# Patient Record
Sex: Female | Born: 1935 | Race: White | Hispanic: No | Marital: Single
Health system: Southern US, Community
[De-identification: ages and names within clinical notes are randomized; demographics above are authoritative.]

---

## 2004-02-22 ENCOUNTER — Other Ambulatory Visit: Payer: Self-pay

## 2004-03-07 ENCOUNTER — Ambulatory Visit: Payer: Self-pay | Admitting: General Practice

## 2004-04-05 ENCOUNTER — Other Ambulatory Visit: Payer: Self-pay

## 2004-04-05 ENCOUNTER — Emergency Department: Payer: Self-pay | Admitting: Emergency Medicine

## 2004-06-25 ENCOUNTER — Ambulatory Visit: Payer: Self-pay

## 2004-08-26 ENCOUNTER — Ambulatory Visit: Payer: Self-pay | Admitting: Internal Medicine

## 2005-08-05 ENCOUNTER — Ambulatory Visit: Payer: Self-pay | Admitting: Internal Medicine

## 2005-09-24 ENCOUNTER — Ambulatory Visit: Payer: Self-pay | Admitting: Internal Medicine

## 2005-10-01 ENCOUNTER — Ambulatory Visit: Payer: Self-pay | Admitting: Internal Medicine

## 2006-01-17 ENCOUNTER — Emergency Department: Payer: Self-pay

## 2006-01-17 ENCOUNTER — Other Ambulatory Visit: Payer: Self-pay

## 2006-04-09 ENCOUNTER — Ambulatory Visit: Payer: Self-pay | Admitting: Internal Medicine

## 2006-06-29 ENCOUNTER — Inpatient Hospital Stay: Payer: Self-pay | Admitting: Internal Medicine

## 2006-06-29 ENCOUNTER — Other Ambulatory Visit: Payer: Self-pay

## 2006-07-16 ENCOUNTER — Ambulatory Visit: Payer: Self-pay | Admitting: General Practice

## 2006-08-23 ENCOUNTER — Ambulatory Visit: Payer: Self-pay | Admitting: General Practice

## 2006-08-30 ENCOUNTER — Ambulatory Visit: Payer: Self-pay | Admitting: General Practice

## 2006-08-31 ENCOUNTER — Other Ambulatory Visit: Payer: Self-pay

## 2006-09-01 ENCOUNTER — Other Ambulatory Visit: Payer: Self-pay

## 2006-09-01 ENCOUNTER — Inpatient Hospital Stay: Payer: Self-pay | Admitting: Internal Medicine

## 2006-10-17 ENCOUNTER — Other Ambulatory Visit: Payer: Self-pay

## 2006-10-17 ENCOUNTER — Emergency Department: Payer: Self-pay | Admitting: Emergency Medicine

## 2007-02-03 ENCOUNTER — Inpatient Hospital Stay: Payer: Self-pay | Admitting: Rheumatology

## 2007-02-03 ENCOUNTER — Other Ambulatory Visit: Payer: Self-pay

## 2007-04-12 ENCOUNTER — Ambulatory Visit: Payer: Self-pay | Admitting: Internal Medicine

## 2007-04-17 ENCOUNTER — Emergency Department: Payer: Self-pay | Admitting: Emergency Medicine

## 2007-05-02 ENCOUNTER — Ambulatory Visit: Payer: Self-pay | Admitting: General Practice

## 2007-05-18 ENCOUNTER — Ambulatory Visit: Payer: Self-pay | Admitting: General Practice

## 2007-05-24 ENCOUNTER — Other Ambulatory Visit: Payer: Self-pay

## 2007-05-24 ENCOUNTER — Emergency Department: Payer: Self-pay | Admitting: Emergency Medicine

## 2007-06-14 ENCOUNTER — Ambulatory Visit: Payer: Self-pay | Admitting: Internal Medicine

## 2007-08-17 ENCOUNTER — Emergency Department: Payer: Self-pay | Admitting: Unknown Physician Specialty

## 2007-08-17 ENCOUNTER — Other Ambulatory Visit: Payer: Self-pay

## 2008-03-13 ENCOUNTER — Emergency Department: Payer: Self-pay | Admitting: Emergency Medicine

## 2008-04-13 ENCOUNTER — Ambulatory Visit: Payer: Self-pay | Admitting: Internal Medicine

## 2008-05-20 ENCOUNTER — Emergency Department: Payer: Self-pay | Admitting: Emergency Medicine

## 2008-05-21 ENCOUNTER — Emergency Department: Payer: Self-pay | Admitting: Emergency Medicine

## 2008-05-27 ENCOUNTER — Emergency Department: Payer: Self-pay | Admitting: Emergency Medicine

## 2008-06-18 ENCOUNTER — Emergency Department: Payer: Self-pay | Admitting: Emergency Medicine

## 2008-10-07 ENCOUNTER — Emergency Department: Payer: Self-pay | Admitting: Unknown Physician Specialty

## 2008-10-08 ENCOUNTER — Inpatient Hospital Stay: Payer: Self-pay | Admitting: Internal Medicine

## 2009-02-24 ENCOUNTER — Emergency Department: Payer: Self-pay | Admitting: Unknown Physician Specialty

## 2009-03-12 ENCOUNTER — Emergency Department: Payer: Self-pay | Admitting: Emergency Medicine

## 2009-03-15 ENCOUNTER — Emergency Department: Payer: Self-pay | Admitting: Emergency Medicine

## 2009-04-15 ENCOUNTER — Ambulatory Visit: Payer: Self-pay | Admitting: Psychiatry

## 2009-07-09 ENCOUNTER — Emergency Department: Payer: Self-pay | Admitting: Emergency Medicine

## 2009-08-01 ENCOUNTER — Ambulatory Visit: Payer: Self-pay

## 2009-09-11 ENCOUNTER — Ambulatory Visit: Payer: Self-pay

## 2010-10-22 IMAGING — CR DG THORACIC SPINE 2-3V
1 series · 2 of 2 positions shown · non-contrast
Comparison: none

REASON FOR EXAM: back pain
COMMENTS:

[Series 1: view not recorded · 0.17mm/px · 2 of 2 slices shown]
[im 1/2]
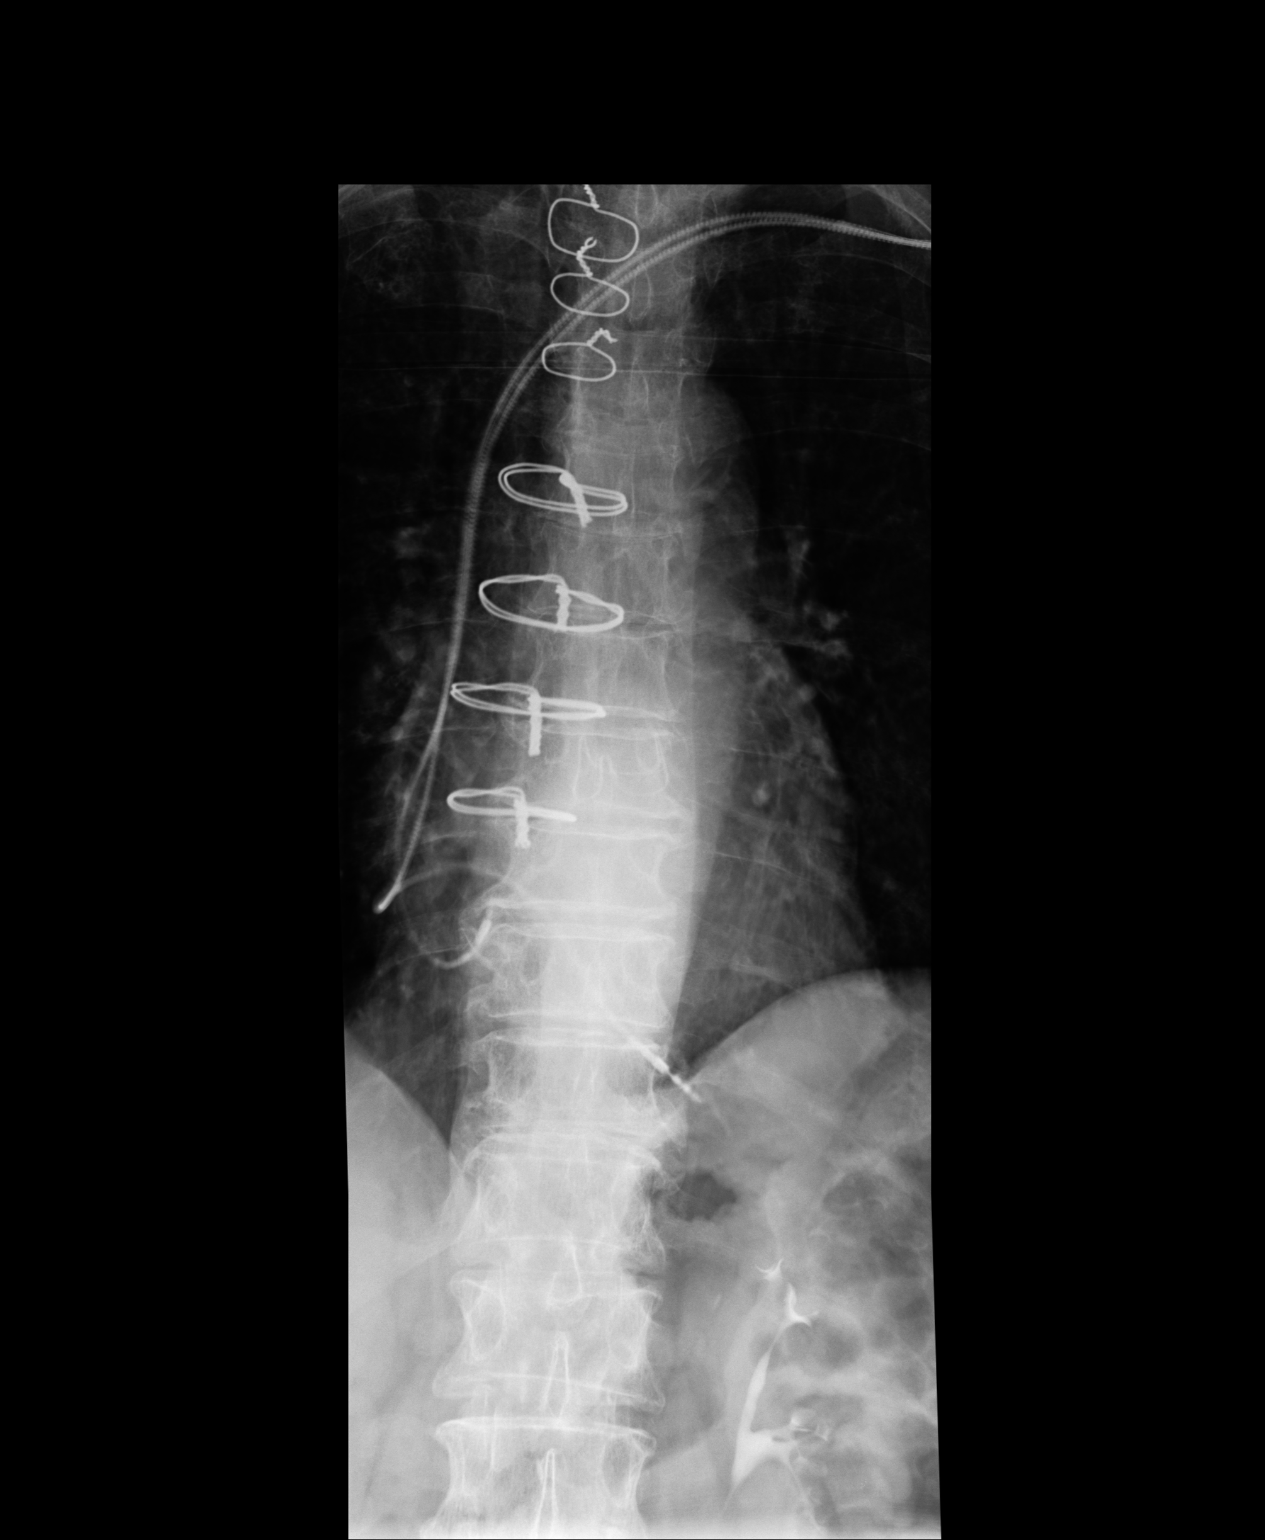
[im 2/2]
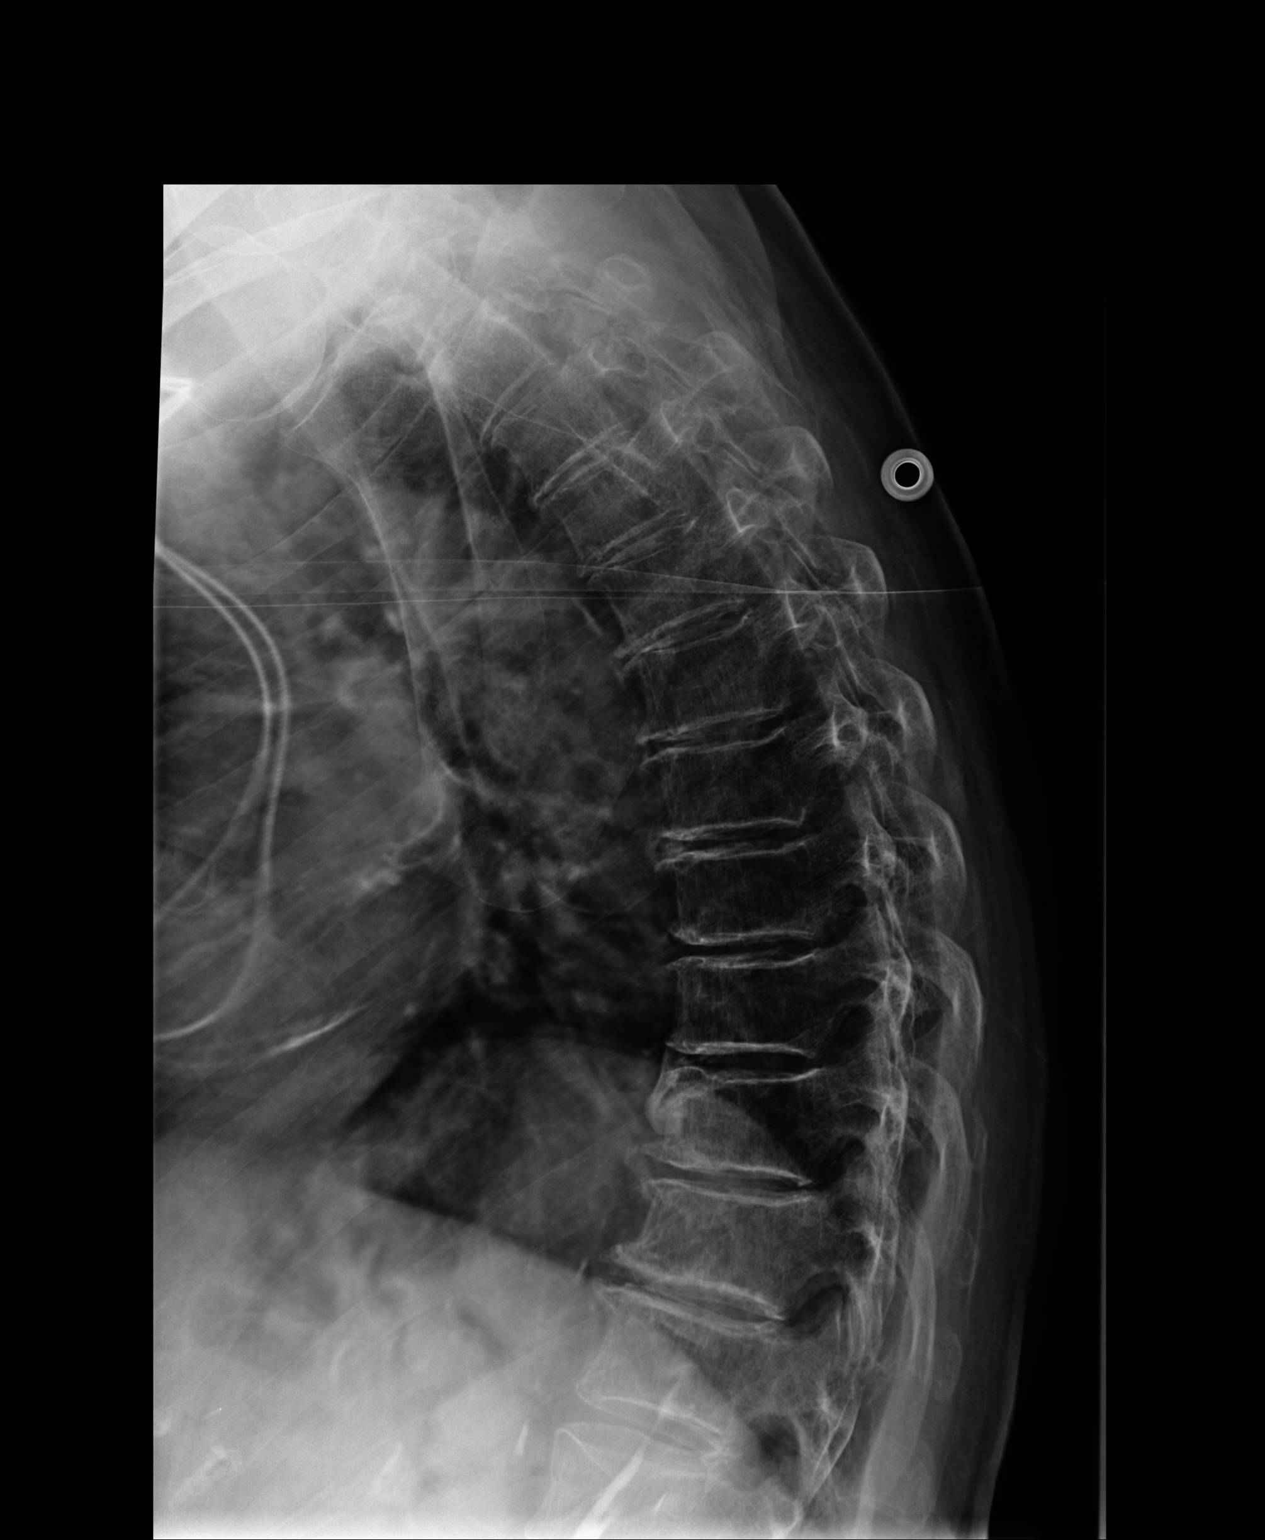

[2 of 2 positions shown; findings below may reference images not displayed]

PROCEDURE:     DXR - DXR THORACIC  AP AND LATERAL  - March 15, 2009  [DATE]

RESULT:     Findings: AP and lateral views of the thoracic spine are
provided. The upper thoracic spine is obscured by the overlying soft tissues
and osseous structures. There is generalized osteopenia. The alignment is
anatomic. There is mild thoracic spine spondylosis.

There is a mild compression fracture of the L1 vertebral body which is of
indeterminate age. If there is further concern regarding the acuity of the
compression fracture further evaluation with MRI or a bone scan may be
helpful.
IMPRESSION: Please see above.

## 2011-03-31 ENCOUNTER — Inpatient Hospital Stay: Payer: Self-pay | Admitting: Internal Medicine

## 2011-03-31 DIAGNOSIS — R5383 Other fatigue: Secondary | ICD-10-CM

## 2011-03-31 DIAGNOSIS — R5381 Other malaise: Secondary | ICD-10-CM

## 2011-04-24 ENCOUNTER — Inpatient Hospital Stay: Payer: Self-pay | Admitting: Internal Medicine

## 2011-04-24 ENCOUNTER — Ambulatory Visit: Payer: Self-pay | Admitting: Gastroenterology

## 2011-04-30 ENCOUNTER — Encounter: Payer: Self-pay | Admitting: Internal Medicine

## 2011-05-02 ENCOUNTER — Encounter: Payer: Self-pay | Admitting: Internal Medicine

## 2011-06-18 ENCOUNTER — Inpatient Hospital Stay: Payer: Self-pay | Admitting: Surgery

## 2011-06-18 LAB — CBC WITH DIFFERENTIAL/PLATELET
Basophil #: 0 10*3/uL (ref 0.0–0.1)
Basophil %: 0.2 %
Eosinophil %: 1 %
HCT: 34.1 % — ABNORMAL LOW (ref 35.0–47.0)
HGB: 11.2 g/dL — ABNORMAL LOW (ref 12.0–16.0)
Lymphocyte #: 1.4 10*3/uL (ref 1.0–3.6)
Lymphocyte %: 14.7 %
MCV: 85 fL (ref 80–100)
Monocyte #: 0.2 10*3/uL (ref 0.0–0.7)
RBC: 4 10*6/uL (ref 3.80–5.20)
RDW: 18 % — ABNORMAL HIGH (ref 11.5–14.5)
WBC: 9.4 10*3/uL (ref 3.6–11.0)

## 2011-06-18 LAB — BASIC METABOLIC PANEL
Anion Gap: 10 (ref 7–16)
BUN: 18 mg/dL (ref 7–18)
Calcium, Total: 8.4 mg/dL — ABNORMAL LOW (ref 8.5–10.1)
Calcium, Total: 8 mg/dL — ABNORMAL LOW (ref 8.5–10.1)
Creatinine: 0.5 mg/dL — ABNORMAL LOW (ref 0.60–1.30)
EGFR (African American): >60
EGFR (African American): >60
EGFR (Non-African Amer.): >60
EGFR (Non-African Amer.): >60
Glucose: 87 mg/dL (ref 65–99)
Glucose: 120 mg/dL — ABNORMAL HIGH (ref 65–99)
Osmolality: 292 (ref 275–301)
Osmolality: 287 (ref 275–301)
Potassium: 3.7 mmol/L (ref 3.5–5.1)
Sodium: 143 mmol/L (ref 136–145)

## 2011-06-19 LAB — CBC WITH DIFFERENTIAL/PLATELET
Basophil #: 0 10*3/uL (ref 0.0–0.1)
Basophil %: 0.1 %
Eosinophil %: 0 %
MCH: 28.2 pg (ref 26.0–34.0)
Monocyte #: 0.3 10*3/uL (ref 0.0–0.7)
Monocyte %: 2.5 %
Neutrophil #: 11.6 10*3/uL — ABNORMAL HIGH (ref 1.4–6.5)
Neutrophil %: 92.3 %
Platelet: 191 10*3/uL (ref 150–440)
RBC: 3.71 10*6/uL — ABNORMAL LOW (ref 3.80–5.20)
RDW: 17.8 % — ABNORMAL HIGH (ref 11.5–14.5)
WBC: 12.6 10*3/uL — ABNORMAL HIGH (ref 3.6–11.0)

## 2011-06-19 LAB — PATHOLOGY REPORT

## 2011-06-19 LAB — BASIC METABOLIC PANEL
Calcium, Total: 8 mg/dL — ABNORMAL LOW (ref 8.5–10.1)
Chloride: 109 mmol/L — ABNORMAL HIGH (ref 98–107)
Co2: 26 mmol/L (ref 21–32)
Creatinine: 0.73 mg/dL (ref 0.60–1.30)
EGFR (African American): >60
EGFR (Non-African Amer.): >60
Glucose: 197 mg/dL — ABNORMAL HIGH (ref 65–99)
Potassium: 4.5 mmol/L (ref 3.5–5.1)
Sodium: 141 mmol/L (ref 136–145)

## 2011-06-21 LAB — CBC WITH DIFFERENTIAL/PLATELET
Basophil #: 0 10*3/uL (ref 0.0–0.1)
Basophil %: 0.3 %
Eosinophil #: 0.2 10*3/uL (ref 0.0–0.7)
Eosinophil %: 4.1 %
Lymphocyte #: 1.2 10*3/uL (ref 1.0–3.6)
Lymphocyte %: 23.7 %
MCH: 28.3 pg (ref 26.0–34.0)
MCHC: 33.6 g/dL (ref 32.0–36.0)
Monocyte %: 6.2 %
Neutrophil #: 3.4 10*3/uL (ref 1.4–6.5)
Neutrophil %: 65.7 %
RBC: 3.18 10*6/uL — ABNORMAL LOW (ref 3.80–5.20)
RDW: 17.3 % — ABNORMAL HIGH (ref 11.5–14.5)
WBC: 5.1 10*3/uL (ref 3.6–11.0)

## 2011-06-21 LAB — BASIC METABOLIC PANEL
Calcium, Total: 7.9 mg/dL — ABNORMAL LOW (ref 8.5–10.1)
Chloride: 105 mmol/L (ref 98–107)
Co2: 30 mmol/L (ref 21–32)
Creatinine: 0.52 mg/dL — ABNORMAL LOW (ref 0.60–1.30)
EGFR (African American): >60
EGFR (Non-African Amer.): >60
Potassium: 3.2 mmol/L — ABNORMAL LOW (ref 3.5–5.1)

## 2011-06-22 LAB — BASIC METABOLIC PANEL
Anion Gap: 7 (ref 7–16)
BUN: 5 mg/dL — ABNORMAL LOW (ref 7–18)
Chloride: 105 mmol/L (ref 98–107)
Co2: 30 mmol/L (ref 21–32)
Creatinine: 0.51 mg/dL — ABNORMAL LOW (ref 0.60–1.30)
EGFR (African American): >60
EGFR (Non-African Amer.): >60
Sodium: 142 mmol/L (ref 136–145)

## 2011-06-22 LAB — CBC WITH DIFFERENTIAL/PLATELET
Basophil #: 0 10*3/uL (ref 0.0–0.1)
Eosinophil %: 7 %
HCT: 27.1 % — ABNORMAL LOW (ref 35.0–47.0)
HGB: 9 g/dL — ABNORMAL LOW (ref 12.0–16.0)
Lymphocyte #: 1 10*3/uL (ref 1.0–3.6)
Lymphocyte %: 23.4 %
MCH: 28.3 pg (ref 26.0–34.0)
MCV: 86 fL (ref 80–100)
Monocyte #: 0.3 10*3/uL (ref 0.0–0.7)
Neutrophil %: 62.4 %
Platelet: 174 10*3/uL (ref 150–440)
RBC: 3.17 10*6/uL — ABNORMAL LOW (ref 3.80–5.20)
RDW: 17.5 % — ABNORMAL HIGH (ref 11.5–14.5)
WBC: 4.1 10*3/uL (ref 3.6–11.0)

## 2011-06-23 LAB — BASIC METABOLIC PANEL
Anion Gap: 10 (ref 7–16)
BUN: 5 mg/dL — ABNORMAL LOW (ref 7–18)
Calcium, Total: 8.1 mg/dL — ABNORMAL LOW (ref 8.5–10.1)
Co2: 28 mmol/L (ref 21–32)
EGFR (African American): >60
EGFR (Non-African Amer.): >60
Sodium: 143 mmol/L (ref 136–145)

## 2011-06-23 LAB — CBC WITH DIFFERENTIAL/PLATELET
Basophil %: 0.5 %
Eosinophil #: 0.3 10*3/uL (ref 0.0–0.7)
Eosinophil %: 7.1 %
HGB: 8.9 g/dL — ABNORMAL LOW (ref 12.0–16.0)
Lymphocyte #: 1.1 10*3/uL (ref 1.0–3.6)
Lymphocyte %: 25.7 %
MCH: 28.5 pg (ref 26.0–34.0)
MCHC: 33.3 g/dL (ref 32.0–36.0)
MCV: 85 fL (ref 80–100)
Monocyte #: 0.3 10*3/uL (ref 0.0–0.7)
Neutrophil %: 59.2 %
RBC: 3.13 10*6/uL — ABNORMAL LOW (ref 3.80–5.20)

## 2011-06-24 ENCOUNTER — Ambulatory Visit: Payer: Self-pay | Admitting: Internal Medicine

## 2011-07-02 ENCOUNTER — Emergency Department: Payer: Self-pay | Admitting: Internal Medicine

## 2011-07-02 ENCOUNTER — Encounter: Payer: Self-pay | Admitting: Internal Medicine

## 2011-07-02 LAB — BASIC METABOLIC PANEL
Anion Gap: 10 (ref 7–16)
BUN: 39 mg/dL — ABNORMAL HIGH (ref 7–18)
Calcium, Total: 8 mg/dL — ABNORMAL LOW (ref 8.5–10.1)
Chloride: 100 mmol/L (ref 98–107)
Co2: 30 mmol/L (ref 21–32)
Creatinine: 0.6 mg/dL (ref 0.60–1.30)
EGFR (African American): >60
EGFR (Non-African Amer.): >60
Glucose: 83 mg/dL (ref 65–99)
Osmolality: 288 (ref 275–301)
Potassium: 3.3 mmol/L — ABNORMAL LOW (ref 3.5–5.1)
Sodium: 140 mmol/L (ref 136–145)

## 2011-07-03 ENCOUNTER — Encounter: Payer: Self-pay | Admitting: Internal Medicine

## 2011-07-10 LAB — CBC WITH DIFFERENTIAL/PLATELET
Eosinophil #: 0.2 10*3/uL (ref 0.0–0.7)
Eosinophil %: 2.9 %
HGB: 11.9 g/dL — ABNORMAL LOW (ref 12.0–16.0)
Monocyte #: 0.3 10*3/uL (ref 0.0–0.7)
Monocyte %: 5 %
Neutrophil %: 71.2 %
Platelet: 303 10*3/uL (ref 150–440)
RBC: 4.32 10*6/uL (ref 3.80–5.20)
RDW: 14.8 % — ABNORMAL HIGH (ref 11.5–14.5)
WBC: 5.3 10*3/uL (ref 3.6–11.0)

## 2011-07-10 LAB — URINALYSIS, COMPLETE
Glucose,UR: NEGATIVE mg/dL (ref 0–75)
Ketone: NEGATIVE
Nitrite: POSITIVE
Ph: 5 (ref 4.5–8.0)
Protein: NEGATIVE
RBC,UR: <1 /HPF (ref 0–5)
Specific Gravity: 1.018 (ref 1.003–1.030)
WBC UR: 10 /HPF (ref 0–5)

## 2011-07-10 LAB — BASIC METABOLIC PANEL
Calcium, Total: 8.1 mg/dL — ABNORMAL LOW (ref 8.5–10.1)
Chloride: 103 mmol/L (ref 98–107)
Co2: 29 mmol/L (ref 21–32)
Creatinine: 0.42 mg/dL — ABNORMAL LOW (ref 0.60–1.30)
EGFR (African American): >60
Glucose: 88 mg/dL (ref 65–99)

## 2011-12-07 ENCOUNTER — Ambulatory Visit: Payer: Self-pay | Admitting: Internal Medicine

## 2011-12-31 ENCOUNTER — Ambulatory Visit: Payer: Self-pay | Admitting: Internal Medicine

## 2012-11-07 IMAGING — CT CT ABD-PELV W/ CM
1 of 4 series · 13 of 32 positions shown, 18 images · non-contrast
Comparison: none

REASON FOR EXAM: (1) abdominal pain weight loss; (2) rt lower quad
abdominal pain
COMMENTS:

[Series 2: abdomen · axial · 0.62mm/px · z∈[-702,-322]mm · 13 of 88 slices shown, 18 images]
[im 6/88  soft-tissue]
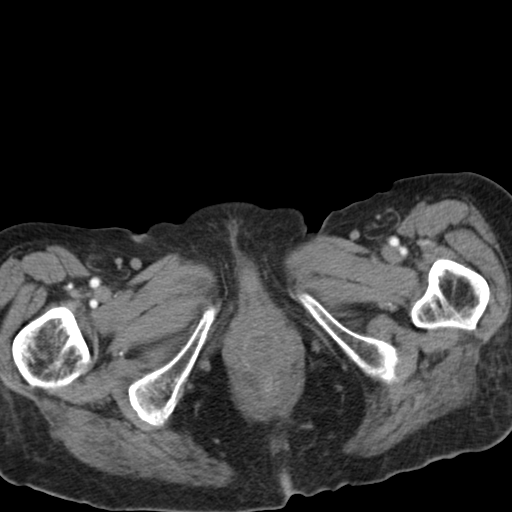
[im 6/88  bone]
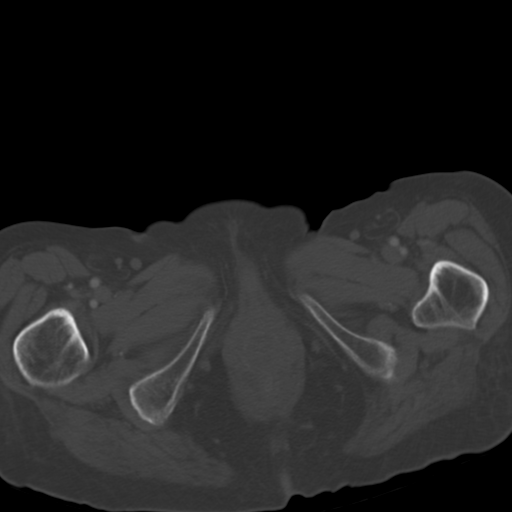
[im 12/88  soft-tissue]
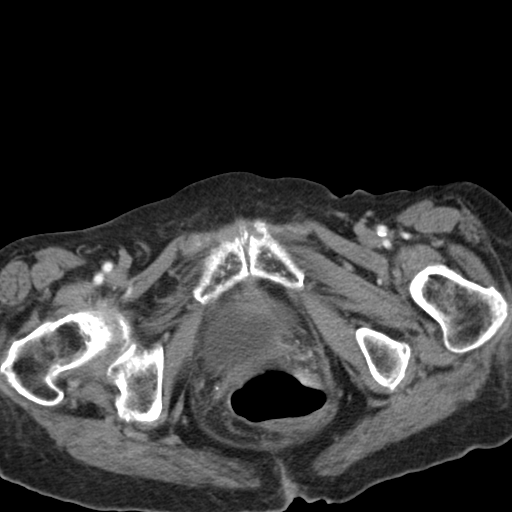
[im 18/88  soft-tissue]
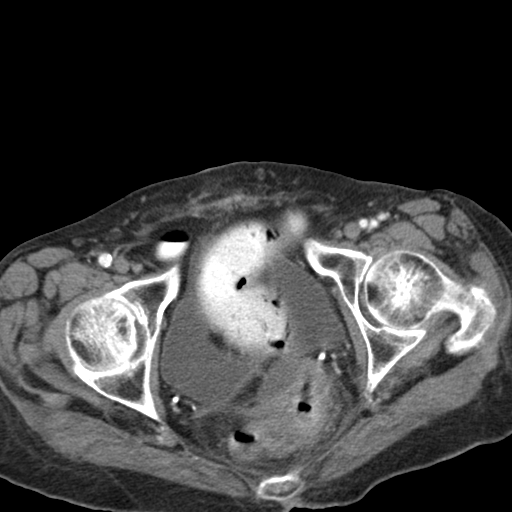
[im 30/88  soft-tissue]
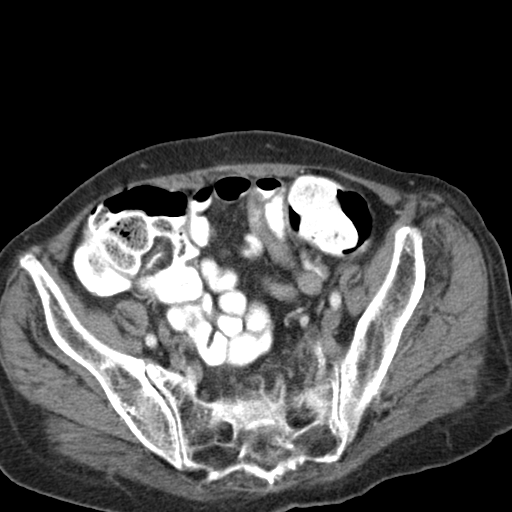
[im 35/88  soft-tissue]
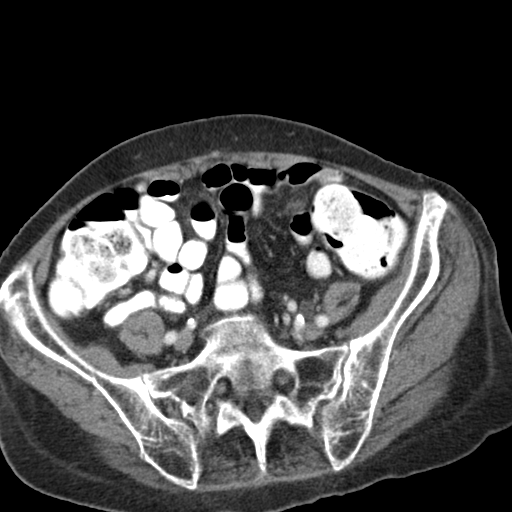
[im 41/88  soft-tissue]
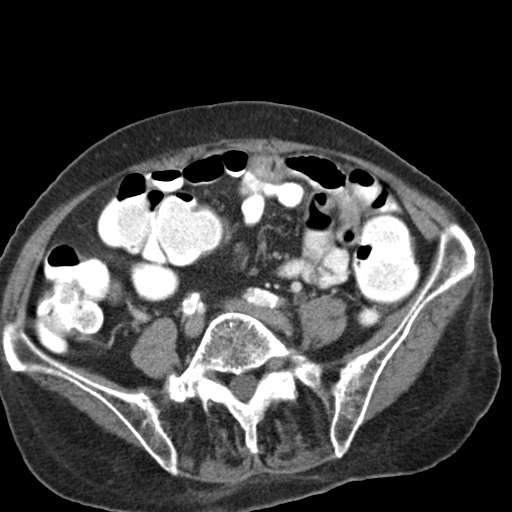
[im 47/88  soft-tissue]
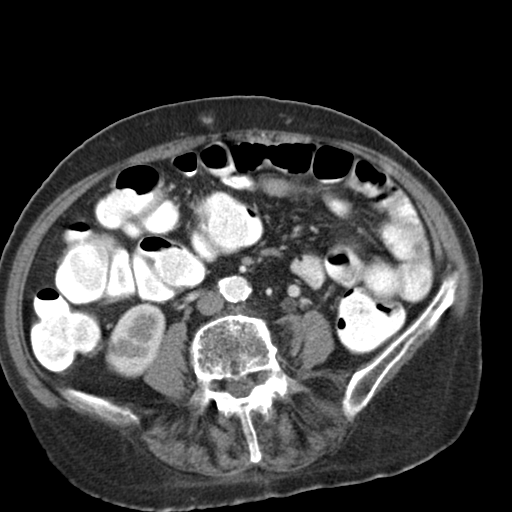
[im 53/88  soft-tissue]
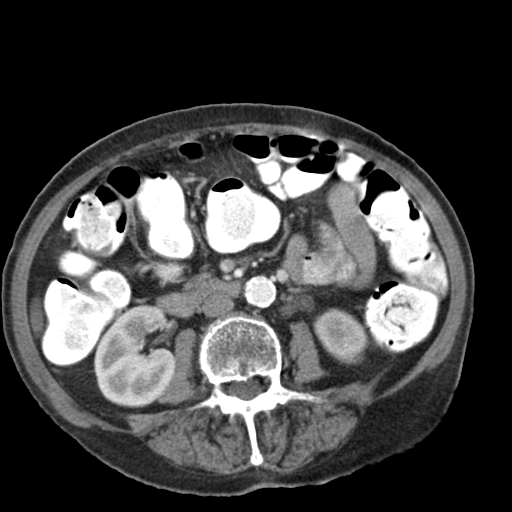
[im 59/88  soft-tissue]
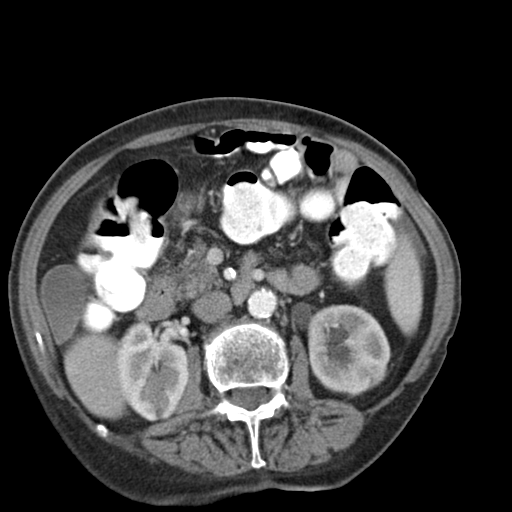
[im 59/88  bone]
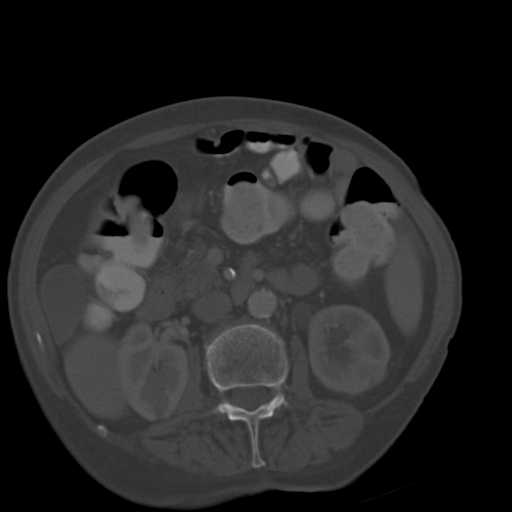
[im 64/88  lung]
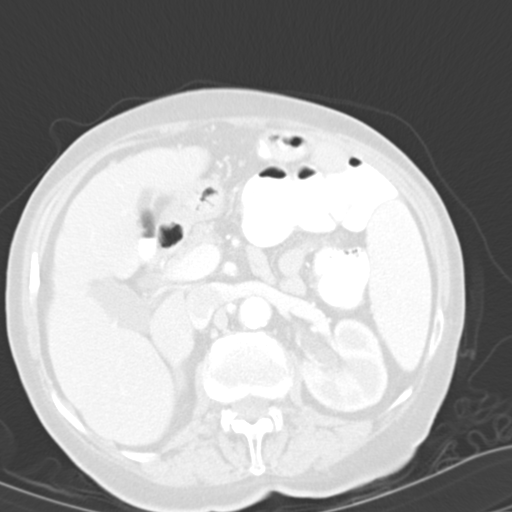
[im 70/88  soft-tissue]
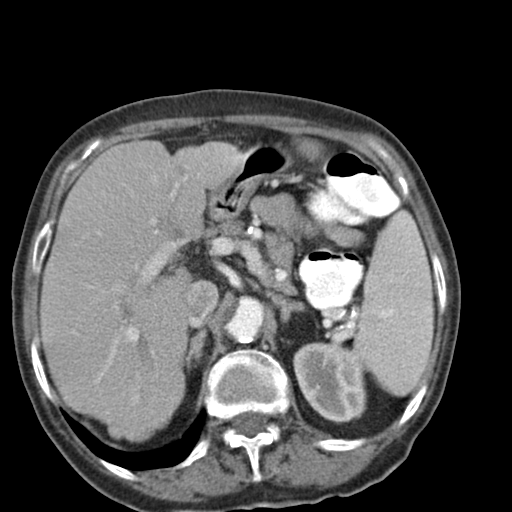
[im 70/88  lung]
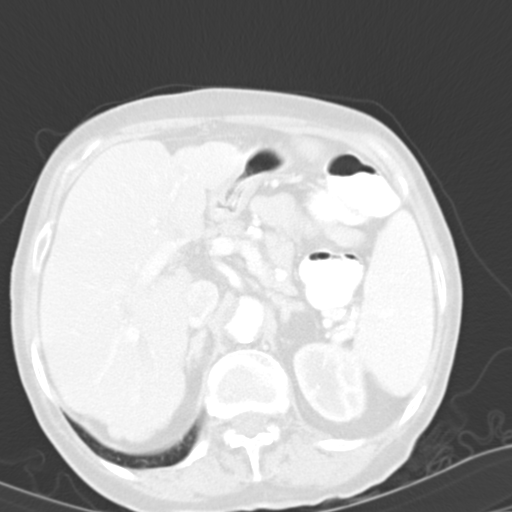
[im 76/88  soft-tissue]
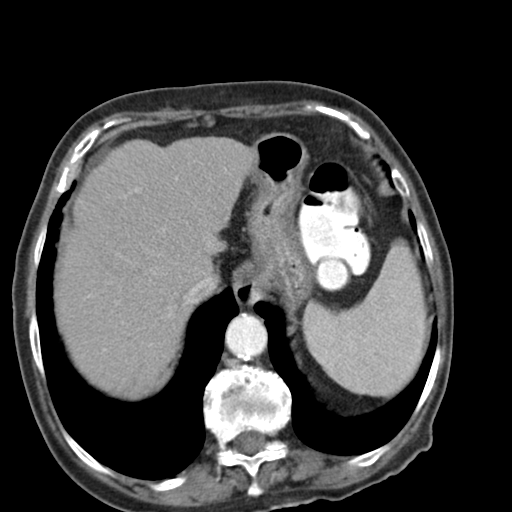
[im 76/88  lung]
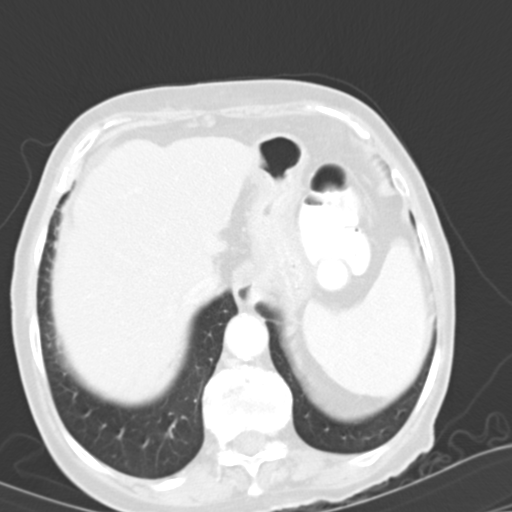
[im 82/88  soft-tissue]
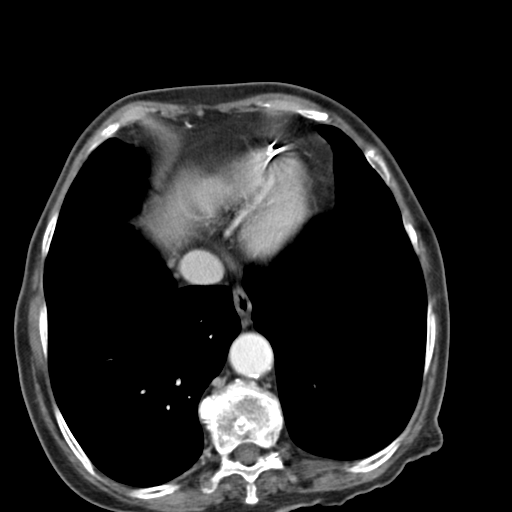
[im 82/88  lung]
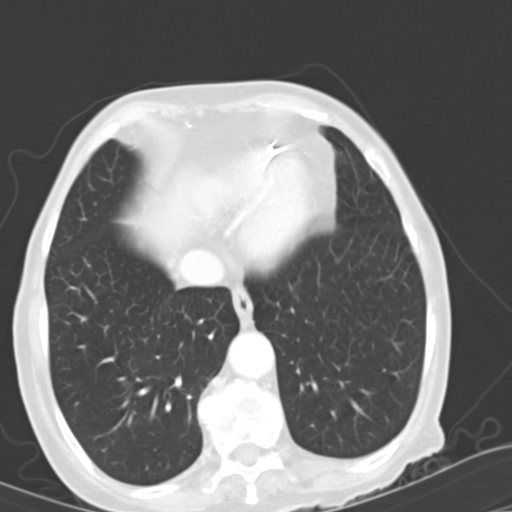

[13 of 32 positions shown; findings below may reference images not displayed]

PROCEDURE:     CT  - CT ABDOMEN / PELVIS  W  - April 01, 2011  [DATE]

RESULT:     Axial CT scanning was performed through the abdomen and pelvis
at 5 mm intervals and slice thicknesses following intravenous ministration
of 85 cc of [id number]. The patient also received oral contrast material.
Review of multiplanar reconstructed images was performed separately on the
VIA monitor.

The orally administered contrast has traversed the small bowel and reached
the rectosigmoid. The sigmoid colon demonstrates thickening of its wall and
poorly defined borders. There is a small extraluminal pelvic gas collection
on images 60 through 67. The appearance is phlegmonous in nature. A discrete
drainable abscess is not demonstrated. Contrast is not present in the rectum
proper. The appearance suggest that of acute diverticulitis and possibly
early diverticular abscess.

The liver, gallbladder, spleen, right kidney, adrenal glands, and
nondistended stomach, and pancreas exhibit no acute abnormality. There is
mild hydronephrosis and hydroureter on the left which may be due to a stone
on image 71, but this could reflect a phlebolith. Mild left hydroureter
could be related to inflammatory changes is in the pelvis from the suspected
diverticulitis.

The lung bases exhibit no acute abnormality. The lumbar vertebral bodies are
preserved in height.
IMPRESSION: 1. The findings are consistent with acute sigmoid diverticulitis.
Phlegmonous change to the left of midline in the pelvis is present. There is
no evidence of obstruction currently. I see no ascites.
2. I see no acute hepatobiliary abnormality.
3. There is mild hydronephrosis on the left which is of uncertain etiology
that may be related to either a stone in the distal left ureter or due to
inflammatory periureteral changes inferiorly on the left.

## 2013-03-01 DEATH — deceased

## 2014-09-23 NOTE — Consult Note (Signed)
PATIENT NAME:  Christy Cross, Christy Cross MR#:  [id number] DATE OF BIRTH:  April 15, 1921  DATE OF CONSULTATION:  04/25/2011  REFERRING PHYSICIAN:   CONSULTING PHYSICIAN:  Scot Junobert T. Casidee Jann, MD  HISTORY OF PRESENT ILLNESS: Patient is an 81 year old white female with a history of atherosclerotic cardiovascular disease, previous heart attack, previous aortic valve replacement, previous pacemaker insertion. She was admitted to the hospital on 03/31/2011 for treatment of acute diverticulitis with probable microperforation per CAT scan. She was treated with Flagyl and Levaquin and she had bout of diarrhea and was given another round of Flagyl but apparently did not tolerate it. She was brought back to the hospital because of abdominal pain and a CAT scan was done showing continued inflammation in the colon and mild left hydroureter and hydronephrosis and continued diverticulitis and contained perforation. She was admitted for further medical care.   PAST MEDICAL HISTORY: 1. History of coronary artery disease, MI.  2. Macular degeneration. 3. Osteoarthritis.  4. Depression.  5. Atrial fibrillation in the past.  6. Chronic low back pain.  7. Dementia.  8. Hyperlipidemia.  9. Aortic stenosis with replacement.  10. Hypertension.  11. Moderate mitral regurgitation.  12. Tobacco use.  13. Left knee replacement.  14. Total abdominal hysterectomy.  15. Right knee arthroscopic surgery. 16. Bilateral rotator cuff surgery.   ALLERGIES: Codeine and sulfa and Percocet.   HABITS: Lives alone but a daughter stays with her at night. Smokes about 1/2 pack a day. No alcohol or drug abuse.   MEDICATIONS ON ADMISSION:  1. Naproxen 250 mg b.i.d.  2. Gabapentin 200 mg at bedtime.  3. Lipitor 40 mg a day.  4. Prilosec 20 mg a day.  5. Calcium 600 mg a day with vitamin D. 6. Aspirin 81 mg a day.  7. Namenda 5 mg a day.  8. Metoprolol 12.5 mg a day.  9. Donepezil 5 mg a day.  10. B12 injection monthly.  11. Lidoderm  patch 5% every 12 hours.  12. MiraLAX p.r.n.  13. Align capsule.  REVIEW OF SYSTEMS: No chest pains. She has some lower abdominal discomfort and some left lower abdominal discomfort. No rectal bleeding.   PHYSICAL EXAMINATION:  GENERAL: Elderly white female in no acute distress. She answers questions appropriately.   VITAL SIGNS: Temperature 98.8, pulse 73, blood pressure 120/80, oxygen saturation 95% on room air.   HEENT: Sclerae nonicteric. Conjunctiva slightly pale. Tongue negative. The head is atraumatic.   NECK: 1/6 carotid bruit both sides could be referred from the aortic valve.   CHEST: Inspiratory crackles in dependent base when she turns to the left.   HEART: Short systolic murmur, loud S2 consistent with aortic valve replacement. Old well-healed midline sternotomy scar.   ABDOMEN: Bowel sounds are present. There is tenderness in the left lower quadrant and some in the right lower quadrant.   EXTREMITIES: No edema.   LABORATORY, DIAGNOSTIC AND RADIOLOGICAL DATA: Glucose 119, BUN 17, creatinine 0.65, sodium 145, potassium 2.4, chloride 106, CO2 30, calcium 8.1, phosphorus 2.4, magnesium 2.3, albumin 3, liver panel otherwise negative. White count 8000, hemoglobin 11, hematocrit 33.7, platelet count 181, pro time 13.6.   ASSESSMENT: Diverticulitis with incomplete resolution with previous antibiotics. I would agree with change of antibiotics given her incomplete resolution and would flavor Invanz or Zosyn and would favor prolonged post hospitalization IV antibiotics with Invanz 1 gram a day for total IV antibiotics for 14 days. She has been started on IV with potassium because of her hypokalemia. Magnesium  is okay. At this time I do not recommend surgery. I will follow with you.  ____________________________ Scot Jun, MD rte:cms D: 04/25/2011 16:17:00 ET T: 04/26/2011 06:54:38 ET JOB#: [id number]  cc: Scot Jun, MD, <Dictator> Barbette Reichmann, MD Adah Salvage.  Excell Seltzer, MD Lynnea Ferrier, MD Scot Jun MD ELECTRONICALLY SIGNED 06/05/2011 19:50

## 2014-09-23 NOTE — Op Note (Signed)
PATIENT NAME:  Christy Cross, Christy Cross MR#:  [id number] DATE OF BIRTH:  1920-12-14  DATE OF PROCEDURE:  06/18/2011  PREOPERATIVE DIAGNOSIS: Sigmoid mass and obstruction.   POSTOPERATIVE DIAGNOSIS: Sigmoid mass and obstruction.   PROCEDURE PERFORMED: Exploratory laparotomy with sigmoid colectomy and end colostomy.   SURGEON: Garrison Michie A. Egbert GaribaldiBird, M.D.   ASSISTANT: Hulda Marinimothy Oaks, M.D.   TYPE OF ANESTHESIA: General endotracheal.   INDICATIONS: This is an 81 year old white female who following an endoscopy today demonstrates near obstruction of her sigmoid colon. In the past, she has been treated for presumed perforated diverticulitis.   FINDINGS: There was an inflammatory mass within the sigmoid colon adherent to the left pelvic sidewall. Intraoperative pathological consultation is consistent with adenocarcinoma.   ESTIMATED BLOOD LOSS: 600 mL.   DESCRIPTION OF PROCEDURE: With the patient in supine position, general endotracheal anesthesia was induced. A Foley catheter was placed. The patient was positioned in dorsal lithotomy. Her abdomen was prepped with ChloraPrep, perineum with Betadine. The abdomen was entered through a lower midline skin incision with muscular fascial layers divided with electrocautery and the skin with a scalpel. Self-retaining abdominal wall retractor was placed. The small bowel contents were packed into the upper abdomen with the help of the Omni retractor. The sigmoid colon was densely adherent in the pelvis. The white line of Toldt was mobilized proximally. The course of the left ureter was identified and preserved during the operation. Distally on the left pelvic sidewall dense adhesions were taken down with a combination of sharp technique, cautery, and blunt technique. An inflammatory process in the left pelvis was identified surrounding this mass. There looked to have been a contained perforation at some point in time with a lots of granulation tissue.   With the sigmoid colon  mobilized off the lateral attachments distally, it was divided with a contoured 40 stapler proximally through a mesenteric window. Mesentery was sequentially scored, cut, clamped and tied with #0 Vicryl suture and with the assistance of the LigaSure apparatus. Sigmoidal vessels were doubly clamped and ligated with 0 Vicryl suture and 3-0 silk ties.   Distally the specimen was divided distal to the mass with a second fire of the contoured 40 stapler and handed off the field. I opened it on the back table and there was a large inflammatory component and small mucosal abnormality consistent with carcinoma.   The pelvis at this point was copiously irrigated. Hemostasis at the staple line was achieved with cautery and the staple line was oversewn with 3-0 silk suture in Lembert fashion. The proximal colon was mobilized further along the white line of Toldt for the construction of and end colostomy. A site on the left lower quadrant was fashioned just to the left of the umbilicus through the rectus sheath with cruciate incisions through the anterior and posterior fascia and the rectus muscle band split. The colon was brought out without any undue tension with proper orientation. It was secured at several sites in the abdominal layer to the peritoneum with silk suture and anteriorly on the anterior fascia with silk suture. With the abdomen copiously irrigated and hemostasis being obtained, the omentum was draped down over the incision and the incision was closed with #1 Vicryls. This was achieved in running fashion. Skin staples were used to reapproximate the skin edges. Colostomy was then matured by excision of the staple line and booking-type sutures of 3-0 chromic. An ostomy appliance was placed and a sterile dressing was applied. The patient was subsequently extubated and taken  to the Recovery Room in stable and satisfactory condition by anesthesia services.  ____________________________ Redge Gainer Egbert Garibaldi,  MD mab:slb D: 06/19/2011 10:06:00 ET    T: 06/19/2011 10:32:06 ET        JOB#: [id number] cc: Loraine Leriche A. Egbert Garibaldi, MD, <Dictator> Raynald Kemp MD ELECTRONICALLY SIGNED 06/19/2011 17:41

## 2014-09-23 NOTE — H&P (Signed)
PATIENT NAME:  Christy Cross, Christy Cross MR#:  [id number] DATE OF BIRTH:  02/01/1921  DATE OF ADMISSION:  06/18/2011  ADMITTING DIAGNOSIS:  Sigmoid mass.  HISTORY OF PRESENT ILLNESS: 81 year old white female whom we saw in consultation on the surgery service in November 2012 with a diagnosis of presumed sigmoid diverticulitis. The patient was treated appropriately for that as an outpatient with intravenous antibiotics via PICC line. She had some improvement. She has had continued abdominal cramping, loose stools, and intermittent bloody diarrhea. I saw her in mid December in my office in [place] at which point a decision was made for her to be referred for an outpatient colonoscopy. Colonoscopy performed today demonstrates a near-obstructing sigmoid colon mass in the distal sigmoid, biopsies of which were obtained by Dr. Mechele Collin. I was asked to see the patient in the colonoscopy suite for consideration of resection. The patient has a history of dementia. She is accompanied by two of her daughters who are her healthcare power of attorney and main providers.   ALLERGIES: Codeine, sulfa, and Percocet.   MEDICATIONS:  1. Aricept 5 mg by mouth at bedtime. 2. Gabapentin 100 mg by mouth b.i.d.  3. Lidoderm patch. 4. Lipitor.  5. Magnesium oxide.  6. Namenda. 7. Omeprazole. 8. Potassium chloride. 9. Baby aspirin. 10. Toprol-XL 12.5 mg per mouth every day. 11. Vitamin B12. 12. Zofran as needed.   PAST MEDICAL HISTORY:  1. Mild dementia.  2. Aortic valve replacement with porcine valve.  3. Permanent implantable pacemaker.  4. History of coronary artery disease.  5. History of non-Q-wave myocardial infarction.  6. Osteoarthritis.  7. Depression.  8. Atrial fibrillation.  9. Chronic low back pain.  10. Hyperlipidemia.  11. Tobacco abuse and dependence.   PAST SURGICAL HISTORY:  1. Aortic valve repair.  2. Left knee replacement. 3. Dual chamber pacemaker placement in 2008. 4. Total hysterectomy  with bilateral salpingo-oophorectomy. 5. Cyst resection from back. 6. Right knee arthroscopy surgery.  7. Bilateral rotator cuff surgery.   FAMILY HISTORY: Significant for cancer, heart disease.   SOCIAL HISTORY: Lives alone with her daughter. Smokes 1/2 pack of cigarettes per day. No drugs or alcohol use.  REVIEW OF SYSTEMS:  As described above. The patient has had abdominal pain, cramps, and diarrhea, with intermittently bloody diarrhea. The remaining ten-point review is unremarkable.   PHYSICAL EXAMINATION:  GENERAL: She is alert and oriented, in no obvious distress, accompanied by both her daughters at the bedside.  VITAL SIGNS: Pulse 80, blood pressure 100/50, oxygen saturation 95% on room air. The patient is alert.  LUNGS: Clear.   HEART: Regular rate and rhythm.   ABDOMEN: Soft, somewhat tender in the left lower quadrant. Questionable left lower quadrant mass effect.   EXTREMITIES: Warm and well perfused.   LABORATORY VALUES: From [facility] dated 06/09/2011: White count is 5.8, hematocrit 38.4, platelet count is 246,000. ProTime is 10.1, INR 0.9, albumin 3.5. Liver function tests are normal. BUN 29, calcium 9.2, creatinine 0.5, chloride 108, potassium 4.7, sodium 137.   IMPRESSION: Sigmoid colon obstruction, sigmoidal diverticular stricture versus neoplastic process.   PLAN: The patient will be admitted. We will recheck her BMP today and obtain a type and screen. I will see whether there is availability for a colectomy later this afternoon based on OR availability time. The patient will be admitted and if not we will seek operative time in the next several days. I discussed this procedure with her daughters and with the patient. All of her questions were  answered.   ____________________________ Redge GainerMark A. Egbert GaribaldiBird, MD mab:bjt D: 06/18/2011 12:21:43 ET T: 06/18/2011 12:50:13 ET JOB#: [id number]  cc: Loraine LericheMark A. Egbert GaribaldiBird, MD, <Dictator> Scot Junobert T. Elliott, MD Barbette ReichmannVishwanath Hande, MD Ritu Gagliardo  Kela MillinA Marianne Golightly MD ELECTRONICALLY SIGNED 06/19/2011 17:40

## 2014-09-23 NOTE — Discharge Summary (Signed)
PATIENT NAME:  Christy Cross, Christy Cross MR#:  [id number] DATE OF BIRTH:  14-Jan-1921  DATE OF ADMISSION:  06/18/2011 DATE OF DISCHARGE:  06/25/2011  BRIEF HISTORY: [name] is an 81 year old woman seen in November 2012 with a presumed diagnosis of acute sigmoid diverticulitis. She's had some problems with abdominal cramping and bloody stools. Decision was made for her to be referred for an outpatient colonoscopy if she continued to have symptoms of abdominal discomfort. She underwent colonoscopy performed by Dr. Lynnae Prudeobert Elliott. There was an area of obstructing sigmoid colon lesion in the distal colon. She was nearly obstructed. For that reason, surgical intervention was recommended. She underwent a sigmoid colostomy with Hartmann's procedure and colostomy and rectal stump. She did quite well postoperatively with rapid return of bowel function. She was started on a liquid diet by the 21st and advanced to a soft diet by the 23rd. She had ostomy function by the 21st. She is up with help getting to the bathroom but is still not ambulating normally on her own. She is eating a regular diet. Her wounds look good. There is no sign of any bowel obstruction. Because of her age and incapacity, she is being referred to rehab facility for physical therapy postdischarge.   DISCHARGE MEDICATIONS:  1. Alprazolam 0.5 mg p.o. b.i.d. for agitation. 2. Milk of Magnesia 30 mL p.o. at bedtime p.r.n.  3. Metoprolol 12.5 mg p.o. daily.  4. Zofran 4 mg p.o. q.6 hours p.r.n.  5. Protonix 40 mg p.o. daily.  6. Tramadol 50 mg p.o. q.6 hours p.r.n. pain.   DISCHARGE INSTRUCTIONS: 1. She will be followed by the physical therapist. 2. She will be evaluated in our office in 7 to 10 days' time.  3. Bathing, activity, and driving instructions were given to the patient.   4. She is to resume her normal activity.   FINAL DISCHARGE DIAGNOSIS: Colon carcinoma with obstruction.     SURGERY: Sigmoid colectomy with Hartmann's  procedure.   ____________________________ Carmie Endalph L. Ely III, MD rle:drc D: 06/25/2011 09:54:24 ET T: 06/25/2011 10:29:42 ET JOB#: [id number]  cc: Quentin Orealph L. Ely III, MD, <Dictator> Barbette ReichmannVishwanath Hande, MD Quentin OreALPH L ELY MD ELECTRONICALLY SIGNED 07/12/2011 21:11

## 2014-09-23 NOTE — Discharge Summary (Signed)
PATIENT NAME:  Christy Cross, [name] MR#:  [id number] DATE OF BIRTH:  03-Nov-1920  DATE OF ADMISSION:  06/18/2011 DATE OF DISCHARGE:  06/25/2011  ADDENDUM  ADDITIONAL MEDICATIONS TO HER DISCHARGE MEDICATIONS:  1. Aricept 5 mg p.o. daily.  2. Gabapentin 100 mg 2 capsules p.o. at bedtime. 3. Lidoderm one patch topically daily.  4. Lipitor 40 mg p.o. daily. 5. Magnesium oxide 400 mg p.o. daily.  6. Namenda 5 mg p.o. daily.  7. Omeprazole 20 mg p.o. daily. 8. PreserVision 1 tablet once a day. 9. Aspirin 81 mg p.o. once a day.    ____________________________ Carmie Endalph L. Ely III, MD rle:ap D: 06/25/2011 17:06:39 ET T: 06/26/2011 07:02:27 ET JOB#: [id number]  cc: Quentin Orealph L. Ely III, MD, <Dictator>  Quentin OreALPH L ELY MD ELECTRONICALLY SIGNED 07/12/2011 21:12

## 2014-09-23 NOTE — Consult Note (Signed)
Pt breathing well, minimal pain, in good spirits.  No new suggestions except to check CEA level.  Electronic Signatures: Scot JunElliott, Larenzo Caples T (MD)  (Signed on 18-Jan-13 15:55)  Authored  Last Updated: 18-Jan-13 15:55 by Scot JunElliott, Jenette Rayson T (MD)
